# Patient Record
Sex: Female | Born: 2000
Health system: Southern US, Community
[De-identification: ages and names within clinical notes are randomized; demographics above are authoritative.]

## PROBLEM LIST (undated history)

## (undated) DIAGNOSIS — E282 Polycystic ovarian syndrome: Secondary | ICD-10-CM

## (undated) DIAGNOSIS — Z8619 Personal history of other infectious and parasitic diseases: Secondary | ICD-10-CM

## (undated) HISTORY — DX: Personal history of other infectious and parasitic diseases: Z86.19

## (undated) HISTORY — PX: ARTHROSCOPIC REPAIR ACL: SUR80

## (undated) HISTORY — PX: WISDOM TOOTH EXTRACTION: SHX21

## (undated) HISTORY — DX: Polycystic ovarian syndrome: E28.2

---
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
## 2018-01-03 ENCOUNTER — Encounter: Payer: Self-pay | Admitting: Physician Assistant

## 2018-01-08 ENCOUNTER — Encounter: Payer: BLUE CROSS/BLUE SHIELD | Attending: Obstetrics and Gynecology | Admitting: Dietician

## 2018-01-08 ENCOUNTER — Encounter: Payer: Self-pay | Admitting: Dietician

## 2018-01-08 DIAGNOSIS — E282 Polycystic ovarian syndrome: Secondary | ICD-10-CM | POA: Insufficient documentation

## 2018-01-08 DIAGNOSIS — E669 Obesity, unspecified: Secondary | ICD-10-CM | POA: Diagnosis present

## 2018-01-08 DIAGNOSIS — Z713 Dietary counseling and surveillance: Secondary | ICD-10-CM | POA: Insufficient documentation

## 2018-01-08 NOTE — Patient Instructions (Signed)
Mindfulness  Healthy choices  Eat slowly without distraction  Stop eating when you are satisfied   Generally a meal should take 20-30 minutes  Before snacking, ask, "Am I really hungry or eating for a different reason?"   If you are not hungry, what could you do instead?  Go to the gym, get out of the house, call a friend.  Mindfulness about portion size.  Choices when eating out Avoiding skipping meals Consider more water rather than sweetened beverages  Stay active:  Aerobic and resistance training (150 minutes per week)   PCOS recommendations: 2 or less servings of dairy per day Consider:  Vitamin D:  2,000 units per day. Increase your Omega 3 (1500 mg-4 g per day) Inositol (1.2-4 grams per day)  Fiber and lean proteins with all meals to include non-starchy vegetables most meals. Limit refined carbs and concentrated sweets in favor of low glycemic foods. See Mediterranean style eating plan.

## 2018-01-08 NOTE — Progress Notes (Signed)
Medical Nutrition Therapy:  Appt start time: 1410 end time:  1515.   Assessment:  Primary concerns today: Patient is here today with her father.  She was recently diagnosed with PCOS and wants to know what she should be eating.  She states that she has a large sweet tooth and would like to lose weight. A1C 4.9% (WNL) at last MD visit.  Weight hx: 210 lbs UBW Lost weight with ACL surgery but lost weight then regained it when her appetite returned.   Currently trying a weight loss pill TriCuts.  She states that she has lost 5 lbs since starting about 2 weeks ago and then feels that she can't lose more.  She is also currently trying to eat better.  Patient lives with her mom and dad and sister. She is in her 1st year of college and goal is to become a physical therapist. 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  She is living at home. Everyone is currently moving.  Preferred Learning Style:   No preference indicated   Learning Readiness:   Ready  MEDICATIONS: Sprintec birth control pill   DIETARY INTAKE:  Usual eating pattern includes 2 meals and 1 snacks per day.  "Picky eater"  Generally does not like vegetables.  Eats out 90% of the time.    24-hr recall:  B ( AM): skips OR granola bar  Snk ( AM): none  L ( PM): Chick [employer] OR salad OR wrap OR quesadilla OR smoothie most often (tropical smoothie- mango magic) Snk ( PM): veggie straw chips or granola bar D ( PM): Steak and cheese, or Stromboli OR [place] Snk ( PM): none Beverages: sweet tea or water, occasionally almond milk  Usual physical activity: likes to go to the gym and do weights 3-4 times per week for 60-90 minutes but none recently due to the move.  Used to do yoga  Progress Towards Goal(s):  In progress.   Nutritional Diagnosis:  NB-1.1 Food and nutrition-related knowledge deficit As related to PCOS and healthy eating.  As evidenced by patient report and diet hx.    Intervention:  Nutrition  counseling/education related to mindful eating, healthier choices when eating out, benefits of exercise, lower glycemic, less processed, whole grain food choices, lean proteins, fiber and supplement suggestions for PCOS.  Discussed benefits of avoiding skipping meals.  Discussed that diets are not recommended, that weight is lost but returns and that a healthy lifestyle is optimal.    Mindfulness  Healthy choices  Eat slowly without distraction  Stop eating when you are satisfied   Generally a meal should take 20-30 minutes  Before snacking, ask, "Am I really hungry or eating for a different reason?"   If you are not hungry, what could you do instead?  Go to the gym, get out of the house, call a friend.  Mindfulness about portion size.  Choices when eating out Avoiding skipping meals Consider more water rather than sweetened beverages  Stay active:  Aerobic and resistance training (150 minutes per week)   PCOS recommendations: 2 or less servings of dairy per day Consider:  Vitamin D:  2,000 units per day. Increase your Omega 3 (1500 mg-4 g per day) Inositol (1.2-4 grams per day)  Fiber and lean proteins with all meals to include non-starchy vegetables most meals. Limit refined carbs and concentrated sweets in favor of low glycemic foods. See Mediterranean style eating plan.       Teaching Method Utilized:  Scientific laboratory technician  Hands on  Handouts given during visit include:  Mediterranean diet  Inositol  Healthier fast food choices  Meal plan card  My plate  Barriers to learning/adherence to lifestyle change: time, picky eater  Demonstrated degree of understanding via:  Teach Back   Monitoring/Evaluation:  Dietary intake, exercise, and body weight prn.

## 2018-01-11 ENCOUNTER — Ambulatory Visit: Payer: Self-pay | Admitting: Physician Assistant

## 2018-02-19 ENCOUNTER — Encounter: Payer: Self-pay | Admitting: Internal Medicine

## 2018-02-19 ENCOUNTER — Ambulatory Visit: Payer: BLUE CROSS/BLUE SHIELD | Admitting: Internal Medicine

## 2018-02-19 VITALS — BP 116/78 | HR 66 | Temp 98.3°F | Ht 64.5 in | Wt 213.0 lb

## 2018-02-19 DIAGNOSIS — E282 Polycystic ovarian syndrome: Secondary | ICD-10-CM | POA: Insufficient documentation

## 2018-02-19 DIAGNOSIS — K582 Mixed irritable bowel syndrome: Secondary | ICD-10-CM | POA: Insufficient documentation

## 2018-02-19 MED ORDER — DICYCLOMINE HCL 10 MG PO CAPS: 10.0000 mg | ORAL_CAPSULE | Freq: Three times a day (TID) | ORAL | 2 refills | Status: AC

## 2018-02-19 NOTE — Progress Notes (Signed)
HPI  Pt presents to the clinic today to establish care and for management of the conditions listed below. She is transferring care from [facility].  PCOS: She is taking Sprintec as prescribed. She does not follow with GYN.  IBS: Alternating constipation and diarrhea. She has been having a lot of cramping lately and wants to know if there is anything she can take for this.   Flu: never Tetanus: 2011 Dentist: as needed  Past Medical History:  Diagnosis Date  . History of chicken pox   . PCOS (polycystic ovarian syndrome)     Current Outpatient Medications  Medication Sig Dispense Refill  . norgestimate-ethinyl estradiol (ORTHO-CYCLEN,SPRINTEC,PREVIFEM) 0.25-35 MG-MCG tablet Take 1 tablet by mouth daily.     No current facility-administered medications for this visit.     No Known Allergies  Family History  Problem Relation Age of Onset  . Alcohol abuse Mother   . Arthritis Mother   . Asthma Father   . Depression Father   . Diabetes Father   . Hyperlipidemia Father   . Hypertension Father   . Arthritis Maternal Grandmother   . Breast cancer Maternal Grandmother   . Miscarriages / Stillbirths Maternal Grandmother   . Hypertension Paternal Grandmother   . Asthma Paternal Grandfather   . Heart disease Paternal Grandfather     Social History   Socioeconomic History  . Marital status: Single    Spouse name: Not on file  . Number of children: Not on file  . Years of education: Not on file  . Highest education level: Not on file  Occupational History  . Not on file  Social Needs  . Financial resource strain: Not on file  . Food insecurity:    Worry: Not on file    Inability: Not on file  . Transportation needs:    Medical: Not on file    Non-medical: Not on file  Tobacco Use  . Smoking status: Current Every Day Smoker    Types: E-cigarettes  . Smokeless tobacco: Never Used  Substance and Sexual Activity  . Alcohol use: No  . Drug use: No  . Sexual  activity: Not on file  Lifestyle  . Physical activity:    Days per week: Not on file    Minutes per session: Not on file  . Stress: Not on file  Relationships  . Social connections:    Talks on phone: Not on file    Gets together: Not on file    Attends religious service: Not on file    Active member of club or organization: Not on file    Attends meetings of clubs or organizations: Not on file    Relationship status: Not on file  . Intimate partner violence:    Fear of current or ex partner: Not on file    Emotionally abused: Not on file    Physically abused: Not on file    Forced sexual activity: Not on file  Other Topics Concern  . Not on file  Social History Narrative  . Not on file    ROS:  Constitutional: Denies fever, malaise, fatigue, headache or abrupt weight changes.  HEENT: Denies eye pain, eye redness, ear pain, ringing in the ears, wax buildup, runny nose, nasal congestion, bloody nose, or sore throat. Respiratory: Denies difficulty breathing, shortness of breath, cough or sputum production.   Cardiovascular: Denies chest pain, chest tightness, palpitations or swelling in the hands or feet.  Gastrointestinal: Pt reports abdominal cramping, alternating constipation  and diarrhea. Denies abdominal pain, bloating, or blood in the stool.  GU: Denies frequency, urgency, pain with urination, blood in urine, odor or discharge. Musculoskeletal: Denies decrease in range of motion, difficulty with gait, muscle pain or joint pain and swelling.  Skin: Denies redness, rashes, lesions or ulcercations.  Neurological: Denies dizziness, difficulty with memory, difficulty with speech or problems with balance and coordination.  Psych: Denies anxiety, depression, SI/HI.  No other specific complaints in a complete review of systems (except as listed in HPI above).  PE:  Ht 5' 4.5" (1.638 m)   Wt 213 lb (96.6 kg)   LMP 02/12/2018   BMI 36.00 kg/m  Wt Readings from Last 3  Encounters:  02/19/18 213 lb (96.6 kg) (98 %, Z= 2.12)*  01/08/18 210 lb (95.3 kg) (98 %, Z= 2.09)*  03/15/16 160 lb (72.6 kg) (91 %, Z= 1.32)*   * Growth percentiles are based on CDC (Girls, 2-20 Years) data.    General: Appears her stated age, obese in NAD. Cardiovascular: Normal rate and rhythm. S1,S2 noted.  No murmur, rubs or gallops noted. Pulmonary/Chest: Normal effort and positive vesicular breath sounds. No respiratory distress. No wheezes, rales or ronchi noted.  Abdomen: Soft and nontender. Normal bowel sounds. No distention or masses noted. Neurological: Alert and oriented.  Psychiatric: Mood and affect normal. Behavior is normal. Judgment and thought content normal.   Assessment and Plan:

## 2018-02-19 NOTE — Assessment & Plan Note (Signed)
Discussed FODMAP diet eRx for Bentyl TID prn for cramping

## 2018-02-19 NOTE — Patient Instructions (Signed)
Diet for Irritable Bowel Syndrome When you have irritable bowel syndrome (IBS), the foods you eat and your eating habits are very important. IBS may cause various symptoms, such as abdominal pain, constipation, or diarrhea. Choosing the right foods can help ease discomfort caused by these symptoms. Work with your health care provider and dietitian to find the best eating plan to help control your symptoms. What general guidelines do I need to follow?  Keep a food diary. This will help you identify foods that cause symptoms. Write down: ? What you eat and when. ? What symptoms you have. ? When symptoms occur in relation to your meals.  Avoid foods that cause symptoms. Talk with your dietitian about other ways to get the same nutrients that are in these foods.  Eat more foods that contain fiber. Take a fiber supplement if directed by your dietitian.  Eat your meals slowly, in a relaxed setting.  Aim to eat 5-6 small meals per day. Do not skip meals.  Drink enough fluids to keep your urine clear or pale yellow.  Ask your health care provider if you should take an over-the-counter probiotic during flare-ups to help restore healthy gut bacteria.  If you have cramping or diarrhea, try making your meals low in fat and high in carbohydrates. Examples of carbohydrates are pasta, rice, whole grain breads and cereals, fruits, and vegetables.  If dairy products cause your symptoms to flare up, try eating less of them. You might be able to handle yogurt better than other dairy products because it contains bacteria that help with digestion. What foods are not recommended? The following are some foods and drinks that may worsen your symptoms:  Fatty foods, such as French fries.  Milk products, such as cheese or ice cream.  Chocolate.  Alcohol.  Products with caffeine, such as coffee.  Carbonated drinks, such as soda.  The items listed above may not be a complete list of foods and beverages to  avoid. Contact your dietitian for more information. What foods are good sources of fiber? Your health care provider or dietitian may recommend that you eat more foods that contain fiber. Fiber can help reduce constipation and other IBS symptoms. Add foods with fiber to your diet a little at a time so that your body can get used to them. Too much fiber at once might cause gas and swelling of your abdomen. The following are some foods that are good sources of fiber:  Apples.  Peaches.  Pears.  Berries.  Figs.  Broccoli (raw).  Cabbage.  Carrots.  Raw peas.  Kidney beans.  Lima beans.  Whole grain bread.  Whole grain cereal.  Where to find more information: International Foundation for Functional Gastrointestinal Disorders: www.iffgd.org National Institute of Diabetes and Digestive and Kidney Diseases: www.niddk.nih.gov/health-information/health-topics/digestive-diseases/ibs/Pages/facts.aspx This information is not intended to replace advice given to you by your health care provider. Make sure you discuss any questions you have with your health care provider. Document Released: 12/16/2003 Document Revised: 03/02/2016 Document Reviewed: 12/26/2013 Elsevier Interactive Patient Education  2018 [employer]  

## 2018-02-19 NOTE — Assessment & Plan Note (Signed)
Continue OCP's for now
